# Patient Record
Sex: Male | Born: 1940 | Race: White | Hispanic: No | Marital: Married | Smoking: Former smoker
Health system: Southern US, Community
[De-identification: ages and names within clinical notes are randomized; demographics above are authoritative.]

## PROBLEM LIST (undated history)

## (undated) DIAGNOSIS — R079 Chest pain, unspecified: Secondary | ICD-10-CM

## (undated) DIAGNOSIS — C801 Malignant (primary) neoplasm, unspecified: Secondary | ICD-10-CM

## (undated) DIAGNOSIS — Z9889 Other specified postprocedural states: Secondary | ICD-10-CM

## (undated) DIAGNOSIS — E785 Hyperlipidemia, unspecified: Secondary | ICD-10-CM

## (undated) DIAGNOSIS — I498 Other specified cardiac arrhythmias: Secondary | ICD-10-CM

## (undated) DIAGNOSIS — I219 Acute myocardial infarction, unspecified: Secondary | ICD-10-CM

## (undated) DIAGNOSIS — I7090 Unspecified atherosclerosis: Secondary | ICD-10-CM

## (undated) DIAGNOSIS — E119 Type 2 diabetes mellitus without complications: Secondary | ICD-10-CM

## (undated) DIAGNOSIS — W19XXXA Unspecified fall, initial encounter: Secondary | ICD-10-CM

## (undated) DIAGNOSIS — I10 Essential (primary) hypertension: Secondary | ICD-10-CM

## (undated) DIAGNOSIS — K429 Umbilical hernia without obstruction or gangrene: Secondary | ICD-10-CM

## (undated) DIAGNOSIS — I2510 Atherosclerotic heart disease of native coronary artery without angina pectoris: Secondary | ICD-10-CM

## (undated) HISTORY — PX: CHOLECYSTECTOMY: SHX55

## (undated) HISTORY — PX: HERNIA REPAIR: SHX51

## (undated) HISTORY — PX: COLON SURGERY: SHX602

## (undated) HISTORY — PX: APPENDECTOMY: SHX54

---

## 1979-01-22 DIAGNOSIS — I219 Acute myocardial infarction, unspecified: Secondary | ICD-10-CM

## 1979-01-22 HISTORY — DX: Acute myocardial infarction, unspecified: I21.9

## 2000-01-22 DIAGNOSIS — C801 Malignant (primary) neoplasm, unspecified: Secondary | ICD-10-CM

## 2000-01-22 HISTORY — DX: Malignant (primary) neoplasm, unspecified: C80.1

## 2004-02-24 ENCOUNTER — Ambulatory Visit: Payer: Self-pay | Admitting: Oncology

## 2004-07-13 ENCOUNTER — Ambulatory Visit: Payer: Self-pay | Admitting: Oncology

## 2004-11-09 ENCOUNTER — Ambulatory Visit: Payer: Self-pay | Admitting: Oncology

## 2005-03-15 ENCOUNTER — Ambulatory Visit: Payer: Self-pay | Admitting: Oncology

## 2005-09-27 ENCOUNTER — Ambulatory Visit: Payer: Self-pay | Admitting: Oncology

## 2006-03-21 ENCOUNTER — Ambulatory Visit: Payer: Self-pay | Admitting: Oncology

## 2006-09-05 ENCOUNTER — Ambulatory Visit: Payer: Self-pay | Admitting: Oncology

## 2009-01-21 HISTORY — PX: CARDIAC CATHETERIZATION: SHX172

## 2015-04-13 DIAGNOSIS — Z9889 Other specified postprocedural states: Secondary | ICD-10-CM

## 2015-04-13 HISTORY — DX: Other specified postprocedural states: Z98.890

## 2016-10-30 DIAGNOSIS — W19XXXA Unspecified fall, initial encounter: Secondary | ICD-10-CM

## 2016-10-30 HISTORY — DX: Unspecified fall, initial encounter: W19.XXXA

## 2017-02-14 NOTE — Progress Notes (Signed)
Please place orders in Epic as patient is being scheduled for a pre-op appointment! Thank you! 

## 2017-02-17 ENCOUNTER — Ambulatory Visit: Payer: Self-pay | Admitting: Orthopedic Surgery

## 2017-02-17 NOTE — H&P (Signed)
Darren Randall is an 77 y.o. male.   Chief Complaint:  Back and right leg pain HPI: Patient reports lower back pain and leg pain on the right. Date of injury 10/30/16. He reports (normal) review of test results. He reports work injury. He reports pain level 8/10 and severe.  The patient is currently working with light duty restrictions of no lifting over 10 lbs, no bending, stooping, squatting, no prolonged standing or sitting and local driving only. NCM is [employer].  Patient reports the epidural steroid injection helped on his right side for about 1 week. Is 8 weeks status post his injury. He is here with [name] his case manager and is on light duty with local driving only.  His physical therapy note was reviewed. Feel his function is approximately at 65%.  Medical History Cancer:  Diabetes:  High Cholesterol:  Hypertension:  Rheumatoid Arthritis:  kidney stone  Surgical History Appendectomy Cholecystectomy Colostomy Hernia Repair Colectomy Resection of polyp Surgical procedure on cervical spine Placement of stent in cardiac conduit  Medications atenolol 100 mg tablet atorvastatin 40 mg tablet finasteride 5 mg tablet metFORMIN 500 mg tablet moexipril 15 mg-hydrochlorothiazide 25 mg tablet omeprazole 20 mg capsule,delayed release tamsulosin 0.4 mg capsule terazosin 2 mg capsule  Family History Mother - Diabetes mellitus   - Heart disease Brother - Heart disease   - Hypertensive disorder  Social History:  has no tobacco, alcohol, and drug history on file.  Allergies: NKDA  Review of Systems  Constitutional: Negative.   HENT: Negative.   Eyes: Negative.   Respiratory: Negative.   Cardiovascular: Negative.   Gastrointestinal: Negative.   Genitourinary: Negative.   Musculoskeletal: Positive for back pain.  Skin: Negative.   Neurological: Positive for sensory change and focal weakness.  Psychiatric/Behavioral: Negative.     There were no vitals taken  for this visit. Physical Exam  Constitutional: He is oriented to person, place, and time. He appears well-developed.  HENT:  Head: Normocephalic.  Eyes: Pupils are equal, round, and reactive to light.  Neck: Normal range of motion.  Cardiovascular: Normal rate.  Respiratory: Effort normal.  GI: Soft.  Musculoskeletal:  Constitutional General Appearance: healthy-appearing, distress (mild)  Psychiatric Mood and Affect: active and alert, anxious  Cardiovascular System Edema Right: none (Dorsalis and posterior tibial pulses 2+) Edema Left: none  Cervical Spine Inspection: alignment normal, no muscle atrophy Bony Palpation: no tenderness of the spinous process Active Range of Motion: flexion normal, extension normal Passive Range of Motion: flexion normal, extension normal  Motor Strength C5 on the Right: abduction deltoid 5/5 C5 on the Left: abduction deltoid 5/5 C6 on the Right: flexion biceps 5/5 C6 on the Left: flexion biceps 5/5 C7 on the Right: extension triceps 5/5, flexion wrist 5/5 C7 on the Left: extension triceps 5/5, flexion wrist 5/5 C8 on the Right: flexion fingers 5/5 C8 on the Left: flexion fingers 5/5 T1 on the Right: abduction fingers 5/5 T1 on the Left: abduction fingers 5/5  Neurological System Biceps Reflex Right: normal (2) Biceps Reflex Left: normal on the left (2) Brachioradialis Reflex Right: normal (2) Brachioradialis Reflex Left: normal (2) Triceps Reflex Right: normal (2) Triceps Reflex Left: normal (2) Sensation on the Right: C5 normal, C6 normal, C7 normal, C8 normal, sensation of the distal extremities normal Sensation on the Left: C5 normal, C6 normal, C7 normal, distal extremities normal Special Tests on the Right: Spurling's test negative Special Tests on the Left: Spurling's test negative Special Tests: Valsalva's test  negative (Hoffman Sign negative) Knee Reflex Right: normal (2) Knee Reflex Left: normal (2) Ankle Reflex Right:  diminished (1) Ankle Reflex Left: normal (2) Babinski Reflex Right: plantar reflex absent Babinski Reflex Left: plantar reflex absent Special Tests on the Right: femoral nerve traction test negative, no clonus of the ankle/knee, supine straight leg raising test positive Special Tests on the Left: femoral nerve traction test negative, seated straight leg raising test negative, no clonus of the ankle/knee  Skin Head and Neck: normal Right Upper Extremity: normal Left Upper Extremity: normal Inspection and palpation: no rash  Gait and Station Appearance: ambulating with no assistive devices, antalgic gait  Abdomen Inspection and Palpation: non-distended, no tenderness  Lumbar Spine Inspection: normal alignment Bony Palpation of the Lumbar Spine: (tender at lumbosacral junction.) Bony Palpation of the Right Hip: no tenderness of the greater trochanter, tenderness of the SI joint (Pelvis stable) Bony Palpation of the Left Hip: no tenderness of the greater trochanter Soft Tissue Palpation on the Right: (No flank pain with percussion) Active Range of Motion: (limited flexion and extention)  Motor Strength L1 Motor Strength on the Right: hip flexion iliopsoas 5/5 L1 Motor Strength on the Left: hip flexion iliopsoas 5/5 L2-L4 Motor Strength on the Right: knee extension quadriceps 5/5 L2-L4 Motor Strength on the Left: knee extension quadriceps 5/5 L5 Motor Strength on the Right: ankle dorsiflexion tibialis anterior 5/5, great toe extension extensor hallucis longus 4/5 L5 Motor Strength on the Left: ankle dorsiflexion tibialis anterior 5/5, great toe extension extensor hallucis longus 5/5 S1 Motor Strength on the Right: plantar flexion gastrocnemius 4/5 S1 Motor Strength on the Left: plantar flexion gastrocnemius 5/5  Neurological: He is alert and oriented to person, place, and time.  Skin: Skin is warm and dry.     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 He does have moderate disc space narrowing as well generating relative spinal stenosis as evidenced in the lateral recess. He had more relief from his first injection. The second injection gave him little relief. The pain radiates into the calf muscle and the S1 nerve root distribution. He has a straight leg raise is positive in slight EHL and plantar flexion weakness  We discussed options including living with his symptoms and analgesics versus a microlumbar decompression L5-S1. He does not want to live with his symptoms we therefore discussed microlumbar decompression. Including risks and benefits of bleeding infection damage to neurovascular to is no change in symptoms worsen his symptoms DVT PE anesthetic complication etc. Overnight hospital sutures out in 2 weeks 4 weeks to light duty sedentary 3 months until maximum medical improvement  He is otherwise healthy does have hypertension will kindly request a preoperative clearance. He is currently on aspirin will need to be off of it for weeks postop  In the interim light duty no lifting over 10 pounds repetitive bending prolonged standing or sitting local driving only. 5 hours per day. Discussed this and from the patient with his case Freight forwarder.  We spent extensive time discussing the options and going over the surgical intervention with a model and a power point presentation.  If he has any changes or reductions in his symptoms in the interim he can call.  I do feel this is related to his initial injury as described.  Plan microlumbar decompression L5-S1 right  Cecilie Kicks., PA-C for Dr. Tonita Cong 02/17/2017, 8:53 AM

## 2017-02-17 NOTE — H&P (View-Only) (Signed)
Darren Randall is an 77 y.o. male.   Chief Complaint:  Back and right leg pain HPI: Patient reports lower back pain and leg pain on the right. Date of injury 10/30/16. He reports (normal) review of test results. He reports work injury. He reports pain level 8/10 and severe.  The patient is currently working with light duty restrictions of no lifting over 10 lbs, no bending, stooping, squatting, no prolonged standing or sitting and local driving only. NCM is [employer].  Patient reports the epidural steroid injection helped on his right side for about 1 week. Is 8 weeks status post his injury. He is here with [name] his case manager and is on light duty with local driving only.  His physical therapy note was reviewed. Feel his function is approximately at 65%.  Medical History Cancer:  Diabetes:  High Cholesterol:  Hypertension:  Rheumatoid Arthritis:  kidney stone  Surgical History Appendectomy Cholecystectomy Colostomy Hernia Repair Colectomy Resection of polyp Surgical procedure on cervical spine Placement of stent in cardiac conduit  Medications atenolol 100 mg tablet atorvastatin 40 mg tablet finasteride 5 mg tablet metFORMIN 500 mg tablet moexipril 15 mg-hydrochlorothiazide 25 mg tablet omeprazole 20 mg capsule,delayed release tamsulosin 0.4 mg capsule terazosin 2 mg capsule  Family History Mother - Diabetes mellitus   - Heart disease Brother - Heart disease   - Hypertensive disorder  Social History:  has no tobacco, alcohol, and drug history on file.  Allergies: NKDA  Review of Systems  Constitutional: Negative.   HENT: Negative.   Eyes: Negative.   Respiratory: Negative.   Cardiovascular: Negative.   Gastrointestinal: Negative.   Genitourinary: Negative.   Musculoskeletal: Positive for back pain.  Skin: Negative.   Neurological: Positive for sensory change and focal weakness.  Psychiatric/Behavioral: Negative.     There were no vitals taken  for this visit. Physical Exam  Constitutional: He is oriented to person, place, and time. He appears well-developed.  HENT:  Head: Normocephalic.  Eyes: Pupils are equal, round, and reactive to light.  Neck: Normal range of motion.  Cardiovascular: Normal rate.  Respiratory: Effort normal.  GI: Soft.  Musculoskeletal:  Constitutional General Appearance: healthy-appearing, distress (mild)  Psychiatric Mood and Affect: active and alert, anxious  Cardiovascular System Edema Right: none (Dorsalis and posterior tibial pulses 2+) Edema Left: none  Cervical Spine Inspection: alignment normal, no muscle atrophy Bony Palpation: no tenderness of the spinous process Active Range of Motion: flexion normal, extension normal Passive Range of Motion: flexion normal, extension normal  Motor Strength C5 on the Right: abduction deltoid 5/5 C5 on the Left: abduction deltoid 5/5 C6 on the Right: flexion biceps 5/5 C6 on the Left: flexion biceps 5/5 C7 on the Right: extension triceps 5/5, flexion wrist 5/5 C7 on the Left: extension triceps 5/5, flexion wrist 5/5 C8 on the Right: flexion fingers 5/5 C8 on the Left: flexion fingers 5/5 T1 on the Right: abduction fingers 5/5 T1 on the Left: abduction fingers 5/5  Neurological System Biceps Reflex Right: normal (2) Biceps Reflex Left: normal on the left (2) Brachioradialis Reflex Right: normal (2) Brachioradialis Reflex Left: normal (2) Triceps Reflex Right: normal (2) Triceps Reflex Left: normal (2) Sensation on the Right: C5 normal, C6 normal, C7 normal, C8 normal, sensation of the distal extremities normal Sensation on the Left: C5 normal, C6 normal, C7 normal, distal extremities normal Special Tests on the Right: Spurling's test negative Special Tests on the Left: Spurling's test negative Special Tests: Valsalva's test  negative (Hoffman Sign negative) Knee Reflex Right: normal (2) Knee Reflex Left: normal (2) Ankle Reflex Right:  diminished (1) Ankle Reflex Left: normal (2) Babinski Reflex Right: plantar reflex absent Babinski Reflex Left: plantar reflex absent Special Tests on the Right: femoral nerve traction test negative, no clonus of the ankle/knee, supine straight leg raising test positive Special Tests on the Left: femoral nerve traction test negative, seated straight leg raising test negative, no clonus of the ankle/knee  Skin Head and Neck: normal Right Upper Extremity: normal Left Upper Extremity: normal Inspection and palpation: no rash  Gait and Station Appearance: ambulating with no assistive devices, antalgic gait  Abdomen Inspection and Palpation: non-distended, no tenderness  Lumbar Spine Inspection: normal alignment Bony Palpation of the Lumbar Spine: (tender at lumbosacral junction.) Bony Palpation of the Right Hip: no tenderness of the greater trochanter, tenderness of the SI joint (Pelvis stable) Bony Palpation of the Left Hip: no tenderness of the greater trochanter Soft Tissue Palpation on the Right: (No flank pain with percussion) Active Range of Motion: (limited flexion and extention)  Motor Strength L1 Motor Strength on the Right: hip flexion iliopsoas 5/5 L1 Motor Strength on the Left: hip flexion iliopsoas 5/5 L2-L4 Motor Strength on the Right: knee extension quadriceps 5/5 L2-L4 Motor Strength on the Left: knee extension quadriceps 5/5 L5 Motor Strength on the Right: ankle dorsiflexion tibialis anterior 5/5, great toe extension extensor hallucis longus 4/5 L5 Motor Strength on the Left: ankle dorsiflexion tibialis anterior 5/5, great toe extension extensor hallucis longus 5/5 S1 Motor Strength on the Right: plantar flexion gastrocnemius 4/5 S1 Motor Strength on the Left: plantar flexion gastrocnemius 5/5  Neurological: He is alert and oriented to person, place, and time.  Skin: Skin is warm and dry.     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 He does have moderate disc space narrowing as well generating relative spinal stenosis as evidenced in the lateral recess. He had more relief from his first injection. The second injection gave him little relief. The pain radiates into the calf muscle and the S1 nerve root distribution. He has a straight leg raise is positive in slight EHL and plantar flexion weakness  We discussed options including living with his symptoms and analgesics versus a microlumbar decompression L5-S1. He does not want to live with his symptoms we therefore discussed microlumbar decompression. Including risks and benefits of bleeding infection damage to neurovascular to is no change in symptoms worsen his symptoms DVT PE anesthetic complication etc. Overnight hospital sutures out in 2 weeks 4 weeks to light duty sedentary 3 months until maximum medical improvement  He is otherwise healthy does have hypertension will kindly request a preoperative clearance. He is currently on aspirin will need to be off of it for weeks postop  In the interim light duty no lifting over 10 pounds repetitive bending prolonged standing or sitting local driving only. 5 hours per day. Discussed this and from the patient with his case Freight forwarder.  We spent extensive time discussing the options and going over the surgical intervention with a model and a power point presentation.  If he has any changes or reductions in his symptoms in the interim he can call.  I do feel this is related to his initial injury as described.  Plan microlumbar decompression L5-S1 right  Cecilie Kicks., PA-C for Dr. Tonita Cong 02/17/2017, 8:53 AM

## 2017-02-25 ENCOUNTER — Encounter (HOSPITAL_COMMUNITY): Payer: Self-pay

## 2017-02-25 ENCOUNTER — Other Ambulatory Visit (HOSPITAL_COMMUNITY): Payer: Self-pay | Admitting: Emergency Medicine

## 2017-02-25 NOTE — Progress Notes (Signed)
LOV Jasmine December, NP 02-03-17 on chart   EKG 01-27-17 on chart from [facility]   Echo 04-14-15 on chart from [place] test 08-07-13 on chart from [facility]

## 2017-02-25 NOTE — Patient Instructions (Signed)
Darren Randall  02/25/2017   Your procedure is scheduled on: 03-05-17  Report to [facility] Main  Entrance    Report to admitting at [facility]   Call this number if you have problems the morning of surgery [phone]     Remember: Do not eat food or drink liquids :After Midnight.     Take these medicines the morning of surgery with A SIP OF WATER: atenolol, atorvastatin, omeprazole, terazosin                                 You may not have any metal on your body including hair pins and              piercings  Do not wear jewelry, make-up, lotions, powders or perfumes, deodorant                  Men may shave face and neck.   Do not bring valuables to the hospital. [name].  Contacts, dentures or bridgework may not be worn into surgery.  Leave suitcase in the car. After surgery it may be brought to your room.                 Please read over the following fact sheets you were given: _____________________________________________________________________            How to Manage Your Diabetes Before and After Surgery  Why is it important to control my blood sugar before and after surgery? . Improving blood sugar levels before and after surgery helps healing and can limit problems. . A way of improving blood sugar control is eating a healthy diet by: o  Eating less sugar and carbohydrates o  Increasing activity/exercise o  Talking with your doctor about reaching your blood sugar goals . High blood sugars (greater than 180 mg/dL) can raise your risk of infections and slow your recovery, so you will need to focus on controlling your diabetes during the weeks before surgery. . Make sure that the doctor who takes care of your diabetes knows about your planned surgery including the date and location.  How do I manage my blood sugar before surgery? . Check your blood sugar at least 4 times a day, starting 2  days before surgery, to make sure that the level is not too high or low. o Check your blood sugar the morning of your surgery when you wake up and every 2 hours until you get to the Short Stay unit. . If your blood sugar is less than 70 mg/dL, you will need to treat for low blood sugar: o Do not take insulin. o Treat a low blood sugar (less than 70 mg/dL) with  cup of clear juice (cranberry or apple), 4 glucose tablets, OR glucose gel. o Recheck blood sugar in 15 minutes after treatment (to make sure it is greater than 70 mg/dL). If your blood sugar is not greater than 70 mg/dL on recheck, call [phone] for further instructions. . Report your blood sugar to the short stay nurse when you get to Short Stay.  . If you are admitted to the hospital after surgery: o Your blood sugar will be checked by the staff and you will probably be  given insulin after surgery (instead of oral diabetes medicines) to make sure you have good blood sugar levels. o The goal for blood sugar control after surgery is 80-180 mg/dL.   WHAT DO I DO ABOUT MY DIABETES MEDICATION?   . THE DAY BEFORE SURGERY, take  METFORMIN normally       THE MORNING OF SURGERY, Do not take oral diabetes medicines (pills)   Patient Signature:  Date:   Nurse Signature:  Date:   Reviewed and Endorsed by [name] Patient Education Committee, August 2015    [facility] - Preparing for Surgery Before surgery, you can play an important role.  Because skin is not sterile, your skin needs to be as free of germs as possible.  You can reduce the number of germs on your skin by washing with CHG (chlorahexidine gluconate) soap before surgery.  CHG is an antiseptic cleaner which kills germs and bonds with the skin to continue killing germs even after washing. Please DO NOT use if you have an allergy to CHG or antibacterial soaps.  If your skin becomes reddened/irritated stop using the CHG and inform your nurse when you arrive at Short  Stay. Do not shave (including legs and underarms) for at least 48 hours prior to the first CHG shower.  You may shave your face/neck. Please follow these instructions carefully:  1.  Shower with CHG Soap the night before surgery and the  morning of Surgery.  2.  If you choose to wash your hair, wash your hair first as usual with your  normal  shampoo.  3.  After you shampoo, rinse your hair and body thoroughly to remove the  shampoo.                           4.  Use CHG as you would any other liquid soap.  You can apply chg directly  to the skin and wash                       Gently with a scrungie or clean washcloth.  5.  Apply the CHG Soap to your body ONLY FROM THE NECK DOWN.   Do not use on face/ open                           Wound or open sores. Avoid contact with eyes, ears mouth and genitals (private parts).                       Wash face,  Genitals (private parts) with your normal soap.             6.  Wash thoroughly, paying special attention to the area where your surgery  will be performed.  7.  Thoroughly rinse your body with warm water from the neck down.  8.  DO NOT shower/wash with your normal soap after using and rinsing off  the CHG Soap.                9.  Pat yourself dry with a clean towel.            10.  Wear clean pajamas.            11.  Place clean sheets on your bed the night of your first shower and do not  sleep with pets. Day of Surgery : Do  not apply any lotions/deodorants the morning of surgery.  Please wear clean clothes to the hospital/surgery center.  FAILURE TO FOLLOW THESE INSTRUCTIONS MAY RESULT IN THE CANCELLATION OF YOUR SURGERY PATIENT SIGNATURE_________________________________  NURSE SIGNATURE__________________________________  ________________________________________________________________________   Darren Randall  An incentive spirometer is a tool that can help keep your lungs clear and active. This tool measures how well you are  filling your lungs with each breath. Taking long deep breaths may help reverse or decrease the chance of developing breathing (pulmonary) problems (especially infection) following:  A long period of time when you are unable to move or be active. BEFORE THE PROCEDURE   If the spirometer includes an indicator to show your best effort, your nurse or respiratory therapist will set it to a desired goal.  If possible, sit up straight or lean slightly forward. Try not to slouch.  Hold the incentive spirometer in an upright position. INSTRUCTIONS FOR USE  1. Sit on the edge of your bed if possible, or sit up as far as you can in bed or on a chair. 2. Hold the incentive spirometer in an upright position. 3. Breathe out normally. 4. Place the mouthpiece in your mouth and seal your lips tightly around it. 5. Breathe in slowly and as deeply as possible, raising the piston or the ball toward the top of the column. 6. Hold your breath for 3-5 seconds or for as long as possible. Allow the piston or ball to fall to the bottom of the column. 7. Remove the mouthpiece from your mouth and breathe out normally. 8. Rest for a few seconds and repeat Steps 1 through 7 at least 10 times every 1-2 hours when you are awake. Take your time and take a few normal breaths between deep breaths. 9. The spirometer may include an indicator to show your best effort. Use the indicator as a goal to work toward during each repetition. 10. After each set of 10 deep breaths, practice coughing to be sure your lungs are clear. If you have an incision (the cut made at the time of surgery), support your incision when coughing by placing a pillow or rolled up towels firmly against it. Once you are able to get out of bed, walk around indoors and cough well. You may stop using the incentive spirometer when instructed by your caregiver.  RISKS AND COMPLICATIONS  Take your time so you do not get dizzy or light-headed.  If you are in pain,  you may need to take or ask for pain medication before doing incentive spirometry. It is harder to take a deep breath if you are having pain. AFTER USE  Rest and breathe slowly and easily.  It can be helpful to keep track of a log of your progress. Your caregiver can provide you with a simple table to help with this. If you are using the spirometer at home, follow these instructions: [place] IF:   You are having difficultly using the spirometer.  You have trouble using the spirometer as often as instructed.  Your pain medication is not giving enough relief while using the spirometer.  You develop fever of 100.5 F (38.1 C) or higher. SEEK IMMEDIATE MEDICAL CARE IF:   You cough up bloody sputum that had not been present before.  You develop fever of 102 F (38.9 C) or greater.  You develop worsening pain at or near the incision site. MAKE SURE YOU:   Understand these instructions.  Will watch your condition.  Will get help right away if you are not doing well or get worse. Document Released: 05/20/2006 Document Revised: 04/01/2011 Document Reviewed: 07/21/2006 [facility] Patient Information 2014 [place], [place].   ________________________________________________________________________

## 2017-02-26 ENCOUNTER — Ambulatory Visit (HOSPITAL_COMMUNITY)
Admission: RE | Admit: 2017-02-26 | Discharge: 2017-02-26 | Disposition: A | Discharge status: Home / Self Care | Payer: Worker's Compensation | Source: Ambulatory Visit | Attending: Orthopedic Surgery | Admitting: Orthopedic Surgery

## 2017-02-26 ENCOUNTER — Encounter (HOSPITAL_COMMUNITY)
Admission: RE | Admit: 2017-02-26 | Discharge: 2017-02-26 | Disposition: A | Discharge status: Home / Self Care | Payer: Worker's Compensation | Source: Ambulatory Visit | Attending: Specialist | Admitting: Specialist

## 2017-02-26 ENCOUNTER — Other Ambulatory Visit: Payer: Self-pay

## 2017-02-26 ENCOUNTER — Encounter (HOSPITAL_COMMUNITY): Payer: Self-pay

## 2017-02-26 DIAGNOSIS — M5137 Other intervertebral disc degeneration, lumbosacral region: Secondary | ICD-10-CM | POA: Insufficient documentation

## 2017-02-26 DIAGNOSIS — I700 Atherosclerosis of aorta: Secondary | ICD-10-CM | POA: Insufficient documentation

## 2017-02-26 DIAGNOSIS — M5126 Other intervertebral disc displacement, lumbar region: Secondary | ICD-10-CM

## 2017-02-26 HISTORY — DX: Other specified postprocedural states: Z98.890

## 2017-02-26 HISTORY — DX: Acute myocardial infarction, unspecified: I21.9

## 2017-02-26 HISTORY — DX: Other specified cardiac arrhythmias: I49.8

## 2017-02-26 HISTORY — DX: Malignant (primary) neoplasm, unspecified: C80.1

## 2017-02-26 HISTORY — DX: Atherosclerotic heart disease of native coronary artery without angina pectoris: I25.10

## 2017-02-26 HISTORY — DX: Essential (primary) hypertension: I10

## 2017-02-26 HISTORY — DX: Unspecified fall, initial encounter: W19.XXXA

## 2017-02-26 HISTORY — DX: Unspecified atherosclerosis: I70.90

## 2017-02-26 HISTORY — DX: Umbilical hernia without obstruction or gangrene: K42.9

## 2017-02-26 HISTORY — DX: Type 2 diabetes mellitus without complications: E11.9

## 2017-02-26 HISTORY — DX: Chest pain, unspecified: R07.9

## 2017-02-26 HISTORY — DX: Hyperlipidemia, unspecified: E78.5

## 2017-02-26 LAB — BASIC METABOLIC PANEL
Anion gap: 6 (ref 5–15)
BUN: 16 mg/dL (ref 6–20)
CHLORIDE: 108 mmol/L (ref 101–111)
CO2: 25 mmol/L (ref 22–32)
CREATININE: 0.93 mg/dL (ref 0.61–1.24)
Calcium: 8.6 mg/dL — ABNORMAL LOW (ref 8.9–10.3)
GFR calc Af Amer: >60 mL/min (ref >60)
GFR calc non Af Amer: >60 mL/min (ref >60)
GLUCOSE: 119 mg/dL — AB (ref 65–99)
Potassium: 4.3 mmol/L (ref 3.5–5.1)
Sodium: 139 mmol/L (ref 135–145)

## 2017-02-26 LAB — CBC
HCT: 41.9 % (ref 39.0–52.0)
Hemoglobin: 14.3 g/dL (ref 13.0–17.0)
MCH: 31.7 pg (ref 26.0–34.0)
MCHC: 34.1 g/dL (ref 30.0–36.0)
MCV: 92.9 fL (ref 78.0–100.0)
PLATELETS: 138 10*3/uL — AB (ref 150–400)
RBC: 4.51 MIL/uL (ref 4.22–5.81)
RDW: 12.9 % (ref 11.5–15.5)
WBC: 7.2 10*3/uL (ref 4.0–10.5)

## 2017-02-26 LAB — HEMOGLOBIN A1C
HEMOGLOBIN A1C: 6.3 % — AB (ref 4.8–5.6)
Mean Plasma Glucose: 134.11 mg/dL

## 2017-02-26 LAB — SURGICAL PCR SCREEN
MRSA, PCR: NEGATIVE
Staphylococcus aureus: NEGATIVE

## 2017-02-26 LAB — GLUCOSE, CAPILLARY: GLUCOSE-CAPILLARY: 108 mg/dL — AB (ref 65–99)

## 2017-03-05 ENCOUNTER — Ambulatory Visit (HOSPITAL_COMMUNITY): Payer: Worker's Compensation

## 2017-03-05 ENCOUNTER — Other Ambulatory Visit: Payer: Self-pay

## 2017-03-05 ENCOUNTER — Ambulatory Visit (HOSPITAL_COMMUNITY): Payer: Worker's Compensation | Admitting: Anesthesiology

## 2017-03-05 ENCOUNTER — Encounter (HOSPITAL_COMMUNITY)
Admission: RE | Disposition: A | Discharge status: Home / Self Care | Payer: Self-pay | Source: Ambulatory Visit | Attending: Specialist

## 2017-03-05 ENCOUNTER — Encounter (HOSPITAL_COMMUNITY): Payer: Self-pay | Admitting: Anesthesiology

## 2017-03-05 ENCOUNTER — Ambulatory Visit (HOSPITAL_COMMUNITY)
Admission: RE | Admit: 2017-03-05 | Discharge: 2017-03-06 | Disposition: A | Discharge status: Home / Self Care | Payer: Worker's Compensation | Source: Ambulatory Visit | Attending: Specialist | Admitting: Specialist

## 2017-03-05 DIAGNOSIS — I252 Old myocardial infarction: Secondary | ICD-10-CM | POA: Insufficient documentation

## 2017-03-05 DIAGNOSIS — G8929 Other chronic pain: Secondary | ICD-10-CM

## 2017-03-05 DIAGNOSIS — I10 Essential (primary) hypertension: Secondary | ICD-10-CM | POA: Diagnosis not present

## 2017-03-05 DIAGNOSIS — E119 Type 2 diabetes mellitus without complications: Secondary | ICD-10-CM | POA: Insufficient documentation

## 2017-03-05 DIAGNOSIS — M5127 Other intervertebral disc displacement, lumbosacral region: Secondary | ICD-10-CM | POA: Insufficient documentation

## 2017-03-05 DIAGNOSIS — E785 Hyperlipidemia, unspecified: Secondary | ICD-10-CM | POA: Diagnosis not present

## 2017-03-05 DIAGNOSIS — Z85038 Personal history of other malignant neoplasm of large intestine: Secondary | ICD-10-CM | POA: Diagnosis not present

## 2017-03-05 DIAGNOSIS — I2510 Atherosclerotic heart disease of native coronary artery without angina pectoris: Secondary | ICD-10-CM | POA: Diagnosis not present

## 2017-03-05 DIAGNOSIS — Z7982 Long term (current) use of aspirin: Secondary | ICD-10-CM | POA: Insufficient documentation

## 2017-03-05 DIAGNOSIS — Z7984 Long term (current) use of oral hypoglycemic drugs: Secondary | ICD-10-CM | POA: Insufficient documentation

## 2017-03-05 DIAGNOSIS — M549 Dorsalgia, unspecified: Secondary | ICD-10-CM

## 2017-03-05 DIAGNOSIS — Z79899 Other long term (current) drug therapy: Secondary | ICD-10-CM | POA: Diagnosis not present

## 2017-03-05 DIAGNOSIS — M069 Rheumatoid arthritis, unspecified: Secondary | ICD-10-CM | POA: Insufficient documentation

## 2017-03-05 DIAGNOSIS — Z87891 Personal history of nicotine dependence: Secondary | ICD-10-CM | POA: Diagnosis not present

## 2017-03-05 DIAGNOSIS — I700 Atherosclerosis of aorta: Secondary | ICD-10-CM | POA: Diagnosis not present

## 2017-03-05 DIAGNOSIS — M4807 Spinal stenosis, lumbosacral region: Secondary | ICD-10-CM | POA: Insufficient documentation

## 2017-03-05 DIAGNOSIS — M5126 Other intervertebral disc displacement, lumbar region: Secondary | ICD-10-CM | POA: Diagnosis present

## 2017-03-05 HISTORY — PX: LUMBAR LAMINECTOMY/DECOMPRESSION MICRODISCECTOMY: SHX5026

## 2017-03-05 LAB — GLUCOSE, CAPILLARY
GLUCOSE-CAPILLARY: 128 mg/dL — AB (ref 65–99)
GLUCOSE-CAPILLARY: 154 mg/dL — AB (ref 65–99)
Glucose-Capillary: 197 mg/dL — ABNORMAL HIGH (ref 65–99)

## 2017-03-05 SURGERY — LUMBAR LAMINECTOMY/DECOMPRESSION MICRODISCECTOMY 1 LEVEL
Anesthesia: General | Site: Back | Laterality: Right

## 2017-03-05 MED ORDER — TERAZOSIN HCL 2 MG PO CAPS
2.0000 mg | ORAL_CAPSULE | Freq: Every day | ORAL | Status: DC
  Administered 2017-03-06: 2 mg via ORAL
  Filled 2017-03-05: qty 1

## 2017-03-05 MED ORDER — ALUM & MAG HYDROXIDE-SIMETH 200-200-20 MG/5ML PO SUSP: 30.0000 mL | Freq: Four times a day (QID) | ORAL | Status: DC | PRN

## 2017-03-05 MED ORDER — HYDROCHLOROTHIAZIDE 25 MG PO TABS
25.0000 mg | ORAL_TABLET | Freq: Every day | ORAL | Status: DC
  Filled 2017-03-05: qty 1

## 2017-03-05 MED ORDER — OXYCODONE-ACETAMINOPHEN 5-325 MG PO TABS
1.0000 | ORAL_TABLET | ORAL | 0 refills | Status: AC | PRN
Start: 2017-03-05 — End: ?

## 2017-03-05 MED ORDER — CEFAZOLIN SODIUM-DEXTROSE 2-4 GM/100ML-% IV SOLN
2.0000 g | INTRAVENOUS | Status: AC
  Administered 2017-03-05: 2 g via INTRAVENOUS
  Filled 2017-03-05: qty 100

## 2017-03-05 MED ORDER — SODIUM CHLORIDE 0.9 % IV SOLN
INTRAVENOUS | Status: AC
  Filled 2017-03-05: qty 500000

## 2017-03-05 MED ORDER — PROPOFOL 10 MG/ML IV BOLUS
INTRAVENOUS | Status: AC
Start: 2017-03-05 — End: 2017-03-05
  Filled 2017-03-05: qty 40

## 2017-03-05 MED ORDER — ONDANSETRON HCL 4 MG/2ML IJ SOLN: 4.0000 mg | Freq: Four times a day (QID) | INTRAMUSCULAR | Status: DC | PRN

## 2017-03-05 MED ORDER — PHENYLEPHRINE 40 MCG/ML (10ML) SYRINGE FOR IV PUSH (FOR BLOOD PRESSURE SUPPORT)
PREFILLED_SYRINGE | INTRAVENOUS | Status: AC
  Filled 2017-03-05: qty 10

## 2017-03-05 MED ORDER — ONDANSETRON HCL 4 MG/2ML IJ SOLN: 4.0000 mg | Freq: Once | INTRAMUSCULAR | Status: DC | PRN

## 2017-03-05 MED ORDER — PHENOL 1.4 % MT LIQD: 1.0000 | OROMUCOSAL | Status: DC | PRN

## 2017-03-05 MED ORDER — EPHEDRINE 5 MG/ML INJ
INTRAVENOUS | Status: AC
  Filled 2017-03-05: qty 10

## 2017-03-05 MED ORDER — TRANDOLAPRIL 2 MG PO TABS
2.0000 mg | ORAL_TABLET | Freq: Every day | ORAL | Status: DC
  Filled 2017-03-05: qty 1

## 2017-03-05 MED ORDER — ONDANSETRON HCL 4 MG PO TABS: 4.0000 mg | ORAL_TABLET | Freq: Four times a day (QID) | ORAL | Status: DC | PRN

## 2017-03-05 MED ORDER — MAGNESIUM CITRATE PO SOLN: 1.0000 | Freq: Once | ORAL | Status: DC | PRN

## 2017-03-05 MED ORDER — OXYCODONE HCL 5 MG PO TABS
5.0000 mg | ORAL_TABLET | ORAL | Status: DC | PRN
  Administered 2017-03-05: 5 mg via ORAL
  Filled 2017-03-05: qty 1

## 2017-03-05 MED ORDER — FENTANYL CITRATE (PF) 100 MCG/2ML IJ SOLN: 25.0000 ug | INTRAMUSCULAR | Status: DC | PRN

## 2017-03-05 MED ORDER — SUGAMMADEX SODIUM 500 MG/5ML IV SOLN
INTRAVENOUS | Status: AC
  Filled 2017-03-05: qty 10

## 2017-03-05 MED ORDER — CEFAZOLIN SODIUM-DEXTROSE 2-4 GM/100ML-% IV SOLN
2.0000 g | Freq: Three times a day (TID) | INTRAVENOUS | Status: AC
  Administered 2017-03-05 – 2017-03-06 (×2): 2 g via INTRAVENOUS
  Filled 2017-03-05 (×2): qty 100

## 2017-03-05 MED ORDER — METHOCARBAMOL 1000 MG/10ML IJ SOLN
500.0000 mg | Freq: Four times a day (QID) | INTRAVENOUS | Status: DC | PRN
  Administered 2017-03-05: 500 mg via INTRAVENOUS
  Filled 2017-03-05: qty 550

## 2017-03-05 MED ORDER — DEXAMETHASONE SODIUM PHOSPHATE 10 MG/ML IJ SOLN
INTRAMUSCULAR | Status: DC | PRN
  Administered 2017-03-05: 10 mg via INTRAVENOUS

## 2017-03-05 MED ORDER — ONDANSETRON HCL 4 MG/2ML IJ SOLN
INTRAMUSCULAR | Status: DC | PRN
  Administered 2017-03-05: 4 mg via INTRAVENOUS

## 2017-03-05 MED ORDER — MENTHOL 3 MG MT LOZG: 1.0000 | LOZENGE | OROMUCOSAL | Status: DC | PRN

## 2017-03-05 MED ORDER — PROPOFOL 10 MG/ML IV BOLUS
INTRAVENOUS | Status: DC | PRN
  Administered 2017-03-05: 110 mg via INTRAVENOUS

## 2017-03-05 MED ORDER — LIDOCAINE 2% (20 MG/ML) 5 ML SYRINGE
INTRAMUSCULAR | Status: DC | PRN
  Administered 2017-03-05: 100 mg via INTRAVENOUS

## 2017-03-05 MED ORDER — FENTANYL CITRATE (PF) 100 MCG/2ML IJ SOLN
INTRAMUSCULAR | Status: AC
  Filled 2017-03-05: qty 2

## 2017-03-05 MED ORDER — POLYETHYLENE GLYCOL 3350 17 G PO PACK: 17.0000 g | PACK | Freq: Every day | ORAL | Status: DC | PRN

## 2017-03-05 MED ORDER — BUPIVACAINE-EPINEPHRINE 0.5% -1:200000 IJ SOLN
INTRAMUSCULAR | Status: DC | PRN
  Administered 2017-03-05: 10 mL

## 2017-03-05 MED ORDER — POLYETHYLENE GLYCOL 3350 17 G PO PACK: 17.0000 g | PACK | Freq: Every day | ORAL | 0 refills | Status: AC

## 2017-03-05 MED ORDER — ACETAMINOPHEN 325 MG PO TABS
650.0000 mg | ORAL_TABLET | ORAL | Status: DC | PRN
  Administered 2017-03-05: 650 mg via ORAL
  Filled 2017-03-05: qty 2

## 2017-03-05 MED ORDER — INSULIN ASPART 100 UNIT/ML ~~LOC~~ SOLN
0.0000 [IU] | Freq: Three times a day (TID) | SUBCUTANEOUS | Status: DC
  Administered 2017-03-06: 08:00:00 2 [IU] via SUBCUTANEOUS

## 2017-03-05 MED ORDER — OXYCODONE HCL 5 MG PO TABS: 10.0000 mg | ORAL_TABLET | ORAL | Status: DC | PRN

## 2017-03-05 MED ORDER — EPHEDRINE SULFATE-NACL 50-0.9 MG/10ML-% IV SOSY
PREFILLED_SYRINGE | INTRAVENOUS | Status: DC | PRN
  Administered 2017-03-05: 10 mg via INTRAVENOUS
  Administered 2017-03-05 (×2): 5 mg via INTRAVENOUS
  Administered 2017-03-05: 10 mg via INTRAVENOUS
  Administered 2017-03-05: 5 mg via INTRAVENOUS

## 2017-03-05 MED ORDER — DOCUSATE SODIUM 100 MG PO CAPS
100.0000 mg | ORAL_CAPSULE | Freq: Two times a day (BID) | ORAL | Status: DC
  Administered 2017-03-05 – 2017-03-06 (×2): 100 mg via ORAL
  Filled 2017-03-05 (×2): qty 1

## 2017-03-05 MED ORDER — PANTOPRAZOLE SODIUM 40 MG PO TBEC
40.0000 mg | DELAYED_RELEASE_TABLET | Freq: Every day | ORAL | Status: DC
  Administered 2017-03-05 – 2017-03-06 (×2): 40 mg via ORAL
  Filled 2017-03-05 (×2): qty 1

## 2017-03-05 MED ORDER — BUPIVACAINE-EPINEPHRINE (PF) 0.5% -1:200000 IJ SOLN
INTRAMUSCULAR | Status: AC
  Filled 2017-03-05: qty 30

## 2017-03-05 MED ORDER — BISACODYL 5 MG PO TBEC: 5.0000 mg | DELAYED_RELEASE_TABLET | Freq: Every day | ORAL | Status: DC | PRN

## 2017-03-05 MED ORDER — PHENYLEPHRINE 40 MCG/ML (10ML) SYRINGE FOR IV PUSH (FOR BLOOD PRESSURE SUPPORT)
PREFILLED_SYRINGE | INTRAVENOUS | Status: DC | PRN
  Administered 2017-03-05 (×4): 80 ug via INTRAVENOUS

## 2017-03-05 MED ORDER — ACETAMINOPHEN 10 MG/ML IV SOLN
1000.0000 mg | INTRAVENOUS | Status: AC
  Administered 2017-03-05: 1000 mg via INTRAVENOUS
  Filled 2017-03-05: qty 100

## 2017-03-05 MED ORDER — ROCURONIUM BROMIDE 10 MG/ML (PF) SYRINGE
PREFILLED_SYRINGE | INTRAVENOUS | Status: DC | PRN
  Administered 2017-03-05: 10 mg via INTRAVENOUS
  Administered 2017-03-05: 50 mg via INTRAVENOUS

## 2017-03-05 MED ORDER — ATENOLOL 50 MG PO TABS
100.0000 mg | ORAL_TABLET | Freq: Every day | ORAL | Status: DC
  Administered 2017-03-06: 09:00:00 100 mg via ORAL
  Filled 2017-03-05: qty 2

## 2017-03-05 MED ORDER — THROMBIN (RECOMBINANT) 5000 UNITS EX SOLR
CUTANEOUS | Status: AC
  Filled 2017-03-05: qty 10000

## 2017-03-05 MED ORDER — HYDROMORPHONE HCL 1 MG/ML IJ SOLN
0.5000 mg | INTRAMUSCULAR | Status: DC | PRN
  Administered 2017-03-05: 0.5 mg via INTRAVENOUS
  Filled 2017-03-05: qty 0.5

## 2017-03-05 MED ORDER — METHOCARBAMOL 500 MG PO TABS
500.0000 mg | ORAL_TABLET | Freq: Four times a day (QID) | ORAL | Status: DC | PRN
  Administered 2017-03-05: 500 mg via ORAL
  Filled 2017-03-05: qty 1

## 2017-03-05 MED ORDER — LACTATED RINGERS IV SOLN
INTRAVENOUS | Status: DC
  Administered 2017-03-05: 10:00:00 via INTRAVENOUS

## 2017-03-05 MED ORDER — FENTANYL CITRATE (PF) 100 MCG/2ML IJ SOLN
INTRAMUSCULAR | Status: DC | PRN
  Administered 2017-03-05: 50 ug via INTRAVENOUS
  Administered 2017-03-05: 100 ug via INTRAVENOUS
  Administered 2017-03-05: 50 ug via INTRAVENOUS

## 2017-03-05 MED ORDER — DOCUSATE SODIUM 100 MG PO CAPS: 100.0000 mg | ORAL_CAPSULE | Freq: Two times a day (BID) | ORAL | 1 refills | Status: AC | PRN

## 2017-03-05 MED ORDER — MOEXIPRIL-HYDROCHLOROTHIAZIDE 15-25 MG PO TABS: 1.0000 | ORAL_TABLET | Freq: Every day | ORAL | Status: DC

## 2017-03-05 MED ORDER — ACETAMINOPHEN 650 MG RE SUPP: 650.0000 mg | RECTAL | Status: DC | PRN

## 2017-03-05 MED ORDER — KCL IN DEXTROSE-NACL 20-5-0.45 MEQ/L-%-% IV SOLN
INTRAVENOUS | Status: DC
  Administered 2017-03-05: 15:00:00 via INTRAVENOUS
  Filled 2017-03-05 (×2): qty 1000

## 2017-03-05 MED ORDER — FINASTERIDE 5 MG PO TABS
5.0000 mg | ORAL_TABLET | Freq: Every day | ORAL | Status: DC
  Administered 2017-03-06: 5 mg via ORAL
  Filled 2017-03-05 (×2): qty 1

## 2017-03-05 MED ORDER — SODIUM CHLORIDE 0.9 % IV SOLN
INTRAVENOUS | Status: DC | PRN
  Administered 2017-03-05: 500 mL

## 2017-03-05 SURGICAL SUPPLY — 51 items
AGENT HMST SPONGE THK3/8 (HEMOSTASIS)
BAG SPEC THK2 15X12 ZIP CLS (MISCELLANEOUS) ×1
BAG ZIPLOCK 12X15 (MISCELLANEOUS) ×2 IMPLANT
CLEANER TIP ELECTROSURG 2X2 (MISCELLANEOUS) ×3 IMPLANT
CLOSURE WOUND 1/2 X4 (GAUZE/BANDAGES/DRESSINGS) ×1
CLOTH 2% CHLOROHEXIDINE 3PK (PERSONAL CARE ITEMS) ×3 IMPLANT
COVER SURGICAL LIGHT HANDLE (MISCELLANEOUS) ×3 IMPLANT
DRAPE LAPAROTOMY T 98X78 PEDS (DRAPES) ×2 IMPLANT
DRAPE MICROSCOPE LEICA (MISCELLANEOUS) ×3 IMPLANT
DRAPE POUCH INSTRU U-SHP 10X18 (DRAPES) ×3 IMPLANT
DRAPE SHEET LG 3/4 BI-LAMINATE (DRAPES) ×3 IMPLANT
DRAPE SURG 17X11 SM STRL (DRAPES) ×3 IMPLANT
DRAPE UTILITY XL STRL (DRAPES) ×3 IMPLANT
DRSG AQUACEL AG ADV 3.5X 4 (GAUZE/BANDAGES/DRESSINGS) ×2 IMPLANT
DRSG AQUACEL AG ADV 3.5X 6 (GAUZE/BANDAGES/DRESSINGS) IMPLANT
DURAPREP 26ML APPLICATOR (WOUND CARE) ×3 IMPLANT
DURASEAL SPINE SEALANT 3ML (MISCELLANEOUS) IMPLANT
ELECT BLADE TIP CTD 4 INCH (ELECTRODE) ×2 IMPLANT
ELECT REM PT RETURN 15FT ADLT (MISCELLANEOUS) ×3 IMPLANT
GLOVE BIOGEL PI IND STRL 7.0 (GLOVE) ×1 IMPLANT
GLOVE BIOGEL PI INDICATOR 7.0 (GLOVE) ×2
GLOVE SURG SS PI 7.0 STRL IVOR (GLOVE) ×3 IMPLANT
GLOVE SURG SS PI 7.5 STRL IVOR (GLOVE) ×3 IMPLANT
GLOVE SURG SS PI 8.0 STRL IVOR (GLOVE) ×6 IMPLANT
GOWN STRL REUS W/TWL XL LVL3 (GOWN DISPOSABLE) ×6 IMPLANT
HEMOSTAT SPONGE AVITENE ULTRA (HEMOSTASIS) IMPLANT
IV CATH 14GX2 1/4 (CATHETERS) ×3 IMPLANT
KIT BASIN OR (CUSTOM PROCEDURE TRAY) ×3 IMPLANT
KIT POSITIONING SURG ANDREWS (MISCELLANEOUS) ×3 IMPLANT
MANIFOLD NEPTUNE II (INSTRUMENTS) ×3 IMPLANT
NEEDLE SPNL 18GX3.5 QUINCKE PK (NEEDLE) ×6 IMPLANT
PACK LAMINECTOMY ORTHO (CUSTOM PROCEDURE TRAY) ×3 IMPLANT
PATTIES SURGICAL .5 X.5 (GAUZE/BANDAGES/DRESSINGS) IMPLANT
PATTIES SURGICAL .75X.75 (GAUZE/BANDAGES/DRESSINGS) ×3 IMPLANT
PATTIES SURGICAL 1X1 (DISPOSABLE) IMPLANT
RUBBERBAND STERILE (MISCELLANEOUS) ×6 IMPLANT
SPONGE SURGIFOAM ABS GEL 100 (HEMOSTASIS) ×3 IMPLANT
STAPLER VISISTAT (STAPLE) IMPLANT
STRIP CLOSURE SKIN 1/2X4 (GAUZE/BANDAGES/DRESSINGS) ×2 IMPLANT
SUT NURALON 4 0 TR CR/8 (SUTURE) IMPLANT
SUT PROLENE 3 0 PS 2 (SUTURE) ×3 IMPLANT
SUT VIC AB 1 CT1 27 (SUTURE)
SUT VIC AB 1 CT1 27XBRD ANTBC (SUTURE) IMPLANT
SUT VIC AB 1-0 CT2 27 (SUTURE) ×3 IMPLANT
SUT VIC AB 2-0 CT1 27 (SUTURE) ×3
SUT VIC AB 2-0 CT1 TAPERPNT 27 (SUTURE) IMPLANT
SUT VIC AB 2-0 CT2 27 (SUTURE) ×1 IMPLANT
SYR 3ML LL SCALE MARK (SYRINGE) IMPLANT
TOWEL OR 17X26 10 PK STRL BLUE (TOWEL DISPOSABLE) ×3 IMPLANT
TOWEL OR NON WOVEN STRL DISP B (DISPOSABLE) ×2 IMPLANT
YANKAUER SUCT BULB TIP NO VENT (SUCTIONS) ×3 IMPLANT

## 2017-03-05 NOTE — Plan of Care (Signed)
Plan of care discussed.   

## 2017-03-05 NOTE — Anesthesia Procedure Notes (Signed)
Procedure Name: Intubation Date/Time: 03/05/2017 11:09 AM Performed by: Lavina Hamman, CRNA Pre-anesthesia Checklist: Patient identified, Emergency Drugs available, Suction available, Patient being monitored and Timeout performed Patient Re-evaluated:Patient Re-evaluated prior to induction Oxygen Delivery Method: Circle system utilized Preoxygenation: Pre-oxygenation with 100% oxygen Induction Type: IV induction Ventilation: Mask ventilation without difficulty Laryngoscope Size: Mac and 4 Grade View: Grade I Tube type: Oral Tube size: 7.5 mm Number of attempts: 1 Airway Equipment and Method: Stylet Placement Confirmation: ETT inserted through vocal cords under direct vision,  positive ETCO2,  CO2 detector and breath sounds checked- equal and bilateral Secured at: 23 cm Tube secured with: Tape Dental Injury: Teeth and Oropharynx as per pre-operative assessment

## 2017-03-05 NOTE — Discharge Instructions (Signed)

## 2017-03-05 NOTE — Evaluation (Signed)
Physical Therapy Evaluation Patient Details Name: Darren Randall MRN: [id number] DOB: 1940-08-11 Today's Date: 03/05/2017   History of Present Illness  Microlumbar decompression L5-S1 Right   Clinical Impression  The patient reports no back pain,ambulated x 200'. Pt admitted with above diagnosis. Pt currently with functional limitations due to the deficits listed below (see PT Problem List). Pt will benefit from skilled PT to increase their independence and safety with mobility to allow discharge to the venue listed below.       Follow Up Recommendations No PT follow up    Equipment Recommendations  None recommended by PT    Recommendations for Other Services       Precautions / Restrictions Precautions Precautions: Back      Mobility  Bed Mobility Overal bed mobility: Needs Assistance Bed Mobility: Rolling;Sidelying to Sit Rolling: Supervision Sidelying to sit: Supervision       General bed mobility comments: cues for precautions  Transfers Overall transfer level: Needs assistance Equipment used: Rolling walker (2 wheeled) Transfers: Sit to/from Stand Sit to Stand: Supervision            Ambulation/Gait Ambulation/Gait assistance: Supervision Ambulation Distance (Feet): 200 Feet Assistive device: Rolling walker (2 wheeled) Gait Pattern/deviations: Step-through pattern     General Gait Details: cues for safety  Stairs            Wheelchair Mobility    Modified Rankin (Stroke Patients Only)       Balance                                             Pertinent Vitals/Pain      Home Living Family/patient expects to be discharged to:: Private residence Living Arrangements: Spouse/significant other Available Help at Discharge: Family Type of Home: House Home Access: Stairs to enter   Technical brewer of Steps: 1 Home Layout: One level Home Equipment: None      Prior Function Level of Independence: Independent          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        Extremity/Trunk Assessment   Upper Extremity Assessment Upper Extremity Assessment: Defer to OT evaluation    Lower Extremity Assessment Lower Extremity Assessment: Overall WFL for tasks assessed    Cervical / Trunk Assessment Cervical / Trunk Assessment: Normal  Communication   Communication: No difficulties  Cognition Arousal/Alertness: Awake/alert Behavior During Therapy: WFL for tasks assessed/performed Overall Cognitive Status: Within Functional Limits for tasks assessed                                        General Comments      Exercises     Assessment/Plan    PT Assessment Patient needs continued PT services  PT Problem List Decreased activity tolerance;Decreased mobility;Decreased knowledge of precautions       PT Treatment Interventions DME instruction;Gait training;Stair training;Functional mobility training    PT Goals (Current goals can be found in the Care Plan section)  Acute Rehab PT Goals Patient Stated Goal: to go home PT Goal Formulation: With patient/family    Frequency Min 5X/week   Barriers to discharge        Co-evaluation  AM-PAC PT "6 Clicks" Daily Activity  Outcome Measure Difficulty turning over in bed (including adjusting bedclothes, sheets and blankets)?: None Difficulty moving from lying on back to sitting on the side of the bed? : None Difficulty sitting down on and standing up from a chair with arms (e.g., wheelchair, bedside commode, etc,.)?: None Help needed moving to and from a bed to chair (including a wheelchair)?: None Help needed walking in hospital room?: A Little Help needed climbing 3-5 steps with a railing? : A Little 6 Click Score: 22    End of Session   Activity Tolerance: Patient tolerated treatment well Patient left: in chair;with call bell/phone within reach;with chair alarm  set;with family/visitor present Nurse Communication: Mobility status PT Visit Diagnosis: Unsteadiness on feet (R26.81)    Time: 6270-3500 PT Time Calculation (min) (ACUTE ONLY): 15 min   Charges:   PT Evaluation $PT Eval Low Complexity: 1 Low     PT G CodesTresa Endo PT [phone]   [name] 03/05/2017, 4:35 PM

## 2017-03-05 NOTE — Transfer of Care (Signed)
Immediate Anesthesia Transfer of Care Note  Patient: Darren Randall  Procedure(s) Performed: Microlumbar decompression L5-S1 Right (Right Back)  Patient Location: PACU  Anesthesia Type:General  Level of Consciousness: awake, alert  and oriented  Airway & Oxygen Therapy: Patient Spontanous Breathing and Patient connected to face mask oxygen  Post-op Assessment: Report given to RN  Post vital signs: Reviewed and stable  Last Vitals:  Vitals:   03/05/17 0906  BP: 131/84  Pulse: (!) 55  Resp: 18  Temp: 36.8 C  SpO2: 98%    Last Pain:  Vitals:   03/05/17 0906  TempSrc: Oral         Complications: No apparent anesthesia complications

## 2017-03-05 NOTE — Interval H&P Note (Signed)
History and Physical Interval Note:  03/05/2017 10:59 AM  Darren Randall  has presented today for surgery, with the diagnosis of HNP/Stenosis L5-S1 Right  The various methods of treatment have been discussed with the patient and family. After consideration of risks, benefits and other options for treatment, the patient has consented to  Procedure(s) with comments: Microlumbar decompression L5-S1 Right (Right) - 120 mins as a surgical intervention .  The patient's history has been reviewed, patient examined, no change in status, stable for surgery.  I have reviewed the patient's chart and labs.  Questions were answered to the patient's satisfaction.     Oberon Hehir C

## 2017-03-05 NOTE — Brief Op Note (Signed)
03/05/2017  12:16 PM  PATIENT:  Darren Randall  77 y.o. male  PRE-OPERATIVE DIAGNOSIS:  HNP/Stenosis L5-S1 Right  POST-OPERATIVE DIAGNOSIS:  * No post-op diagnosis entered *  PROCEDURE:  Procedure(s) with comments: Microlumbar decompression L5-S1 Right (Right) - 120 mins  SURGEON:  Surgeon(s) and Role:    Susa Day, MD - Primary  PHYSICIAN ASSISTANT:   ASSISTANTS: [place]   ANESTHESIA:   general  EBL:  25 mL   BLOOD ADMINISTERED:none  DRAINS: none   LOCAL MEDICATIONS USED:  MARCAINE     SPECIMEN:  Source of Specimen:  L5S1  DISPOSITION OF SPECIMEN:  PATHOLOGY  COUNTS:  YES  TOURNIQUET:  * No tourniquets in log *  DICTATION: .Other Dictation: Dictation Number [id number]  PLAN OF CARE: Admit for overnight observation  PATIENT DISPOSITION:  PACU - hemodynamically stable.   Delay start of Pharmacological VTE agent (>24hrs) due to surgical blood loss or risk of bleeding: yes

## 2017-03-05 NOTE — Anesthesia Preprocedure Evaluation (Signed)
Anesthesia Evaluation  Patient identified by MRN, date of birth, ID band Patient awake    Reviewed: Allergy & Precautions, NPO status , Patient's Chart, lab work & pertinent test results, reviewed documented beta blocker date and time   Airway Mallampati: II  TM Distance: >3 FB Neck ROM: Full    Dental  (+) Dental Advisory Given, Upper Dentures, Missing   Pulmonary former smoker,    Pulmonary exam normal breath sounds clear to auscultation       Cardiovascular hypertension, Pt. on home beta blockers and Pt. on medications + CAD and + Past MI  Normal cardiovascular exam Rhythm:Regular Rate:Normal     Neuro/Psych RLE weakness negative psych ROS   GI/Hepatic Neg liver ROS, GERD  Medicated and Controlled,  Endo/Other  diabetes, Type 2, Oral Hypoglycemic Agents  Renal/GU negative Renal ROS     Musculoskeletal negative musculoskeletal ROS (+)   Abdominal   Peds  Hematology negative hematology ROS (+)   Anesthesia Other Findings Day of surgery medications reviewed with the patient.  Reproductive/Obstetrics                             Anesthesia Physical Anesthesia Plan  ASA: III  Anesthesia Plan: General   Post-op Pain Management:    Induction: Intravenous  PONV Risk Score and Plan: 2 and Dexamethasone and Ondansetron  Airway Management Planned: Oral ETT  Additional Equipment:   Intra-op Plan:   Post-operative Plan: Extubation in OR  Informed Consent: I have reviewed the patients History and Physical, chart, labs and discussed the procedure including the risks, benefits and alternatives for the proposed anesthesia with the patient or authorized representative who has indicated his/her understanding and acceptance.   Dental advisory given  Plan Discussed with: CRNA  Anesthesia Plan Comments: (Risks/benefits of general anesthesia discussed with patient including risk of damage to  teeth, lips, gum, and tongue, nausea/vomiting, allergic reactions to medications, and the possibility of heart attack, stroke and death.  All patient questions answered.  Patient wishes to proceed.)        Anesthesia Quick Evaluation

## 2017-03-05 NOTE — Anesthesia Postprocedure Evaluation (Signed)
Anesthesia Post Note  Patient: VALENTINE BARNEY  Procedure(s) Performed: Microlumbar decompression L5-S1 Right (Right Back)     Patient location during evaluation: PACU Anesthesia Type: General Level of consciousness: awake and alert Pain management: pain level controlled Vital Signs Assessment: post-procedure vital signs reviewed and stable Respiratory status: spontaneous breathing, nonlabored ventilation and respiratory function stable Cardiovascular status: blood pressure returned to baseline and stable Postop Assessment: no apparent nausea or vomiting Anesthetic complications: no    Last Vitals:  Vitals:   03/05/17 1315 03/05/17 1330  BP: (!) 149/83 (!) 149/79  Pulse: 68 66  Resp: 17 19  Temp:  36.8 C  SpO2: 97% 96%    Last Pain:  Vitals:   03/05/17 1330  TempSrc:   PainSc: 0-No pain    LLE Motor Response: Purposeful movement (03/05/17 1330) LLE Sensation: No numbness;No tingling (03/05/17 1330) RLE Motor Response: Purposeful movement (03/05/17 1330) RLE Sensation: No numbness;No tingling (03/05/17 1330)      Catalina Gravel

## 2017-03-06 ENCOUNTER — Encounter (HOSPITAL_COMMUNITY): Payer: Self-pay | Admitting: Specialist

## 2017-03-06 DIAGNOSIS — M5127 Other intervertebral disc displacement, lumbosacral region: Secondary | ICD-10-CM | POA: Diagnosis not present

## 2017-03-06 LAB — COMPREHENSIVE METABOLIC PANEL
ALBUMIN: 3.3 g/dL — AB (ref 3.5–5.0)
ALK PHOS: 56 U/L (ref 38–126)
ALT: 27 U/L (ref 17–63)
ANION GAP: 10 (ref 5–15)
AST: 24 U/L (ref 15–41)
BUN: 16 mg/dL (ref 6–20)
CALCIUM: 8.3 mg/dL — AB (ref 8.9–10.3)
CHLORIDE: 106 mmol/L (ref 101–111)
CO2: 22 mmol/L (ref 22–32)
CREATININE: 0.74 mg/dL (ref 0.61–1.24)
GFR calc non Af Amer: >60 mL/min (ref >60)
Glucose, Bld: 164 mg/dL — ABNORMAL HIGH (ref 65–99)
Potassium: 4 mmol/L (ref 3.5–5.1)
SODIUM: 138 mmol/L (ref 135–145)
Total Bilirubin: 0.9 mg/dL (ref 0.3–1.2)
Total Protein: 6 g/dL — ABNORMAL LOW (ref 6.5–8.1)

## 2017-03-06 LAB — GLUCOSE, CAPILLARY: Glucose-Capillary: 143 mg/dL — ABNORMAL HIGH (ref 65–99)

## 2017-03-06 MED ORDER — ASPIRIN EC 81 MG PO TBEC: 81.0000 mg | DELAYED_RELEASE_TABLET | Freq: Every day | ORAL | Status: AC

## 2017-03-06 NOTE — Op Note (Signed)
NAME:  Darren Randall, Darren Randall                    ACCOUNT NO.:  MEDICAL RECORD NO.:  [id number]  LOCATION:                                 FACILITY:  PHYSICIAN:  Susa Day, M.D.         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  POSTOPERATIVE DIAGNOSES:  Spinal stenosis, herniated nucleus pulposus, L5-S1, right.  Epidural venous plexus.  PROCEDURE PERFORMED: 1. Microlumbar decompression, L5-S1, right. 2. Foraminotomies, L5-S1, right. 3. Microdiskectomy, L5-S1, right. 4. Lysis of epidural venous plexus.  ANESTHESIA:  General.  ASSISTANT:  Lacie Draft, PA.  SPECIMEN:  L5-S1 disk to Pathology.  HISTORY:  76, right lower extremity radicular pain, S1 nerve root distribution secondary to disk herniation and lateral recess stenosis, indicated for decompression failing conservative treatment.  Risks and benefits were discussed including bleeding, infection, damage to the neurovascular structures, no change in symptoms, worsening symptoms, DVT, PE, anesthetic complications, etc.  TECHNIQUE:  With the patient in supine position after induction of adequate general anesthesia, 2 g of Kefzol, placed prone on the [place] frame.  All bony prominences were well padded.  Lumbar region was prepped and draped in usual sterile fashion.  Two 18-gauge spinal needles were utilized to locate the L5-S1 interspace, confirmed with x- ray.  Incision was made from the spinous process at L5-S1.  Subcutaneous tissue was dissected.  Electrocautery was utilized to achieve hemostasis.  Dorsolumbar fascia was divided in line with skin incision. Paraspinous muscle elevated from lamina of L5-S1.  McCullough retractor was placed, operating microscope draped and brought on the surgical field.  Confirmatory radiograph obtained.  Straight curette was  utilized to detach the ligamentum flavum from the cephalad edge of S1, which was angled inward.  A neuro patty placed beneath the ligamentum flavum. With the neural elements protected, I performed a foraminotomy, generous of S1.  I then identified the S1 nerve root, which was compressed into the lateral recess, gently mobilized it medially.  There was a tethering epidural venous plexus tethering laterally.  The epidural venous plexus was isolated, cauterized and lysed further mobilizing the S1 nerve root medially.  Hypertrophic facet ligamentum was noted.  I decompressed the lateral recess to the medial border of the pedicle protecting the 5 root as well, performing a foraminotomy of L5.  A focal HNP was noted.  I performed an annulotomy and disk material was removed from the disk space with a micropituitary.  Further mobilized with catheter, lavage and removal of disk material.  Confirmatory radiograph obtained.  A neural probe passed freely at the foramen of L5 and S1, following the decompression.  There was 1 cm of excursion of the S1 nerve root medial to pedicle without tension.  There was no disk herniation noted beneath the thecal sac, the axilla or the shoulder of the root or within the foramen.  No evidence of active bleeding or CSF leakage.  I therefore, draped the epidural fat over the S1 nerve root.  We removed the [facility] retractor, irrigated the paraspinous  musculature, closed the dorsolumbar fascia with 1 Vicryl, subcu with 2 and skin with Prolene. Sterile dressing applied.  Placed supine on the hospital bed, extubated without difficulty and transported to the recovery room in satisfactory condition.  The patient tolerated the procedure well.  No complications.  Assistant, Lacie Draft, [place].  Minimal blood loss.     Susa Day, M.D.     Geralynn Rile  D:  03/05/2017  T:  03/05/2017  Job:  [id number]

## 2017-03-06 NOTE — Progress Notes (Signed)
Subjective: 1 Day Post-Op Procedure(s) (LRB): Microlumbar decompression L5-S1 Right (Right) Patient reports pain as mild.  Leg pain resolved. Reports incisional back pain. Voiding without difficulty.  Objective: Vital signs in last 24 hours: Temp:  [98.2 F (36.8 C)-98.6 F (37 C)] 98.6 F (37 C) (02/14 0513) Pulse Rate:  [55-76] 65 (02/14 0513) Resp:  [14-19] 16 (02/14 0513) BP: (126-156)/(60-84) 131/62 (02/14 0513) SpO2:  [92 %-100 %] 96 % (02/14 0513) Weight:  [83 kg (183 lb)] 83 kg (183 lb) (02/13 1350)  Intake/Output from previous day: 02/13 0701 - 02/14 0700 In: 2468.3 [P.O.:900; I.V.:1413.3; IV Piggyback:155] Out: 2113 [Urine:2088; Blood:25] Intake/Output this shift: No intake/output data recorded.  No results for input(s): HGB in the last 72 hours. No results for input(s): WBC, RBC, HCT, PLT in the last 72 hours. Recent Labs    03/06/17 0618  NA 138  K 4.0  CL 106  CO2 22  BUN 16  CREATININE 0.74  GLUCOSE 164*  CALCIUM 8.3*   No results for input(s): LABPT, INR in the last 72 hours.  Neurologically intact ABD soft Neurovascular intact Sensation intact distally Intact pulses distally Dorsiflexion/Plantar flexion intact Incision: dressing C/D/I and no drainage No cellulitis present Compartment soft no calf pain or sign of DVT  Assessment/Plan: 1 Day Post-Op Procedure(s) (LRB): Microlumbar decompression L5-S1 Right (Right) Advance diet Up with therapy D/C IV fluids  Dr. Tonita Cong discussed D/C instructions, dressing instructions, Lspine precautions D/C today   BISSELL, JACLYN M. 03/06/2017, 7:34 AM

## 2017-03-06 NOTE — Discharge Summary (Signed)
Physician Discharge Summary   Patient ID: Darren Randall MRN: [id number] DOB/AGE: [birth year] 75 y.o.  Admit date: 03/05/2017 Discharge date: 03/06/2017  Primary Diagnosis:   HNP/Stenosis L5-S1 Right  Admission Diagnoses:  Past Medical History:  Diagnosis Date  . Arteriosclerosis   . Cancer [facility]) 2002   colon cancer with chemotherapy   . Chest pain    admitted 08-06-13 per PCP paper  records on chart   . Coronary artery disease   . Diabetes mellitus without complication ([place])   . Fall 10/30/2016   caused back injury   . Fluttering heart   . History of Holter monitoring 04/13/2015  . HLD (hyperlipidemia)   . Hypertension   . Myocardial infarction [facility]) 1981   per paper record office visit with Jasmine December, NP 02-03-17   . Umbilical hernia    Discharge Diagnoses:   Principal Problem:   HNP (herniated nucleus pulposus), lumbar  Procedure:  Procedure(s) (LRB): Microlumbar decompression L5-S1 Right (Right)   Consults: none  HPI:  see H&P    Laboratory Data: Hospital Outpatient Visit on 02/26/2017  Component Date Value Ref Range Status  . Glucose-Capillary 02/26/2017 108* 65 - 99 mg/dL Final  . Hgb A1c MFr Bld 02/26/2017 6.3* 4.8 - 5.6 % Final   Comment: (NOTE) Pre diabetes:          5.7%-6.4% Diabetes:              >6.4% Glycemic control for   <7.0% adults with diabetes   . Mean Plasma Glucose 02/26/2017 134.11  mg/dL Final   Performed at [facility] [address] [place], [place] [address]  . Sodium 02/26/2017 139  135 - 145 mmol/L Final  . Potassium 02/26/2017 4.3  3.5 - 5.1 mmol/L Final  . Chloride 02/26/2017 108  101 - 111 mmol/L Final  . CO2 02/26/2017 25  22 - 32 mmol/L Final  . Glucose, Bld 02/26/2017 119* 65 - 99 mg/dL Final  . BUN 02/26/2017 16  6 - 20 mg/dL Final  . Creatinine, Ser 02/26/2017 0.93  0.61 - 1.24 mg/dL Final  . Calcium 02/26/2017 8.6* 8.9 - 10.3 mg/dL Final  . GFR calc non Af Amer 02/26/2017 >60  >60 mL/min Final  . GFR  calc Af Amer 02/26/2017 >60  >60 mL/min Final   Comment: (NOTE) The eGFR has been calculated using the CKD EPI equation. This calculation has not been validated in all clinical situations. eGFR's persistently <60 mL/min signify possible Chronic Kidney Disease.   Georgiann Hahn gap 02/26/2017 6  5 - 15 Final   Performed at [facility], [place] [address] [place], [place] [address]  . WBC 02/26/2017 7.2  4.0 - 10.5 K/uL Final  . RBC 02/26/2017 4.51  4.22 - 5.81 MIL/uL Final  . Hemoglobin 02/26/2017 14.3  13.0 - 17.0 g/dL Final  . HCT 02/26/2017 41.9  39.0 - 52.0 % Final  . MCV 02/26/2017 92.9  78.0 - 100.0 fL Final  . MCH 02/26/2017 31.7  26.0 - 34.0 pg Final  . MCHC 02/26/2017 34.1  30.0 - 36.0 g/dL Final  . RDW 02/26/2017 12.9  11.5 - 15.5 % Final  . Platelets 02/26/2017 138* 150 - 400 K/uL Final   Performed at [facility], [place] [address], [place], [place] [address]  . MRSA, PCR 02/26/2017 NEGATIVE  NEGATIVE Final  . Staphylococcus aureus 02/26/2017 NEGATIVE  NEGATIVE Final   Comment: (NOTE) The Xpert SA Assay (FDA approved for NASAL  specimens in patients 44 years of age and older), is one component of a comprehensive surveillance program. It is not intended to diagnose infection nor to guide or monitor treatment. Performed at [facility], [place] [address], [place], [place] [address]    No results for input(s): HGB in the last 72 hours. No results for input(s): WBC, RBC, HCT, PLT in the last 72 hours. Recent Labs    03/06/17 0618  NA 138  K 4.0  CL 106  CO2 22  BUN 16  CREATININE 0.74  GLUCOSE 164*  CALCIUM 8.3*   No results for input(s): LABPT, INR in the last 72 hours.  X-Rays:Dg Lumbar Spine 2-3 Views  Result Date: 02/26/2017 CLINICAL DATA:  Preop for L5-S1 surgery EXAM: LUMBAR SPINE - 2-3 VIEW COMPARISON:  CT abdomen pelvis of 11/02/2015 FINDINGS: The lumbar vertebrae are in normal alignment. There is mild degenerative  disc disease at L5-S1 where there is some loss of disc space. There is also mild degenerative change at T12-L1 and L1-2. No compression deformity is seen. Mild abdominal aortic atherosclerotic change is present. The SI joints are corticated. IMPRESSION: 1. Normal alignment with mild degenerative disc disease at L5-S1. 2. Mild abdominal aortic atherosclerosis. Electronically Signed   By: Ivar Drape M.D.   On: 02/26/2017 12:13   Dg Spine Portable 1 View  Result Date: 03/05/2017 CLINICAL DATA:  L5-S1 lumbar decompression, intraoperative EXAM: PORTABLE SPINE - 1 VIEW COMPARISON:  Intraoperative lumbar spine radiograph from earlier today FINDINGS: Posterior approach surgical marking device terminates over the posterior L5-S1 disc. Mild multilevel lumbar degenerative disc disease. IMPRESSION: Posterior approach surgical marking device terminates over the posterior L5-S1 disc. Electronically Signed   By: Ilona Sorrel M.D.   On: 03/05/2017 12:17   Dg Spine Portable 1 View  Result Date: 03/05/2017 CLINICAL DATA:  L5-S1 decompression. EXAM: PORTABLE SPINE - 1 VIEW COMPARISON:  Intraoperative x-rays from same day. FINDINGS: Surgical instrumentation projects over the S1 spinous process. No acute osseous abnormality. Facet arthropathy and degenerative disc disease at L5-S1. IMPRESSION: Intraoperative localization as described above. Electronically Signed   By: Titus Dubin M.D.   On: 03/05/2017 11:46   Dg Spine Portable 1 View  Result Date: 03/05/2017 CLINICAL DATA:  Decompressive laminectomy EXAM: PORTABLE SPINE - 1 VIEW COMPARISON:  February 26, 2017 lumbar spine radiograph FINDINGS: Cross-table lateral lumbar image labeled #1 submitted. Metallic probe tips are posterior to the superior aspect of the L5 vertebral body and superior to the S1 vertebral body respectively. The probe tips are posterior to the superior and inferior aspects of the L5 spinous process respectively. No fracture or spondylolisthesis. There  is mild disc space narrowing at L5-S1. There is aortic atherosclerosis. IMPRESSION: Metallic probe tips are posterior to the superior inferior aspects of the L5 spinous process with tips directed toward the superior aspect of the L5 and superior aspect of the S1 vertebral body respectively. No fracture or spondylolisthesis. There is mild disc space narrowing at L5-S1. There is aortic atherosclerosis. Aortic Atherosclerosis (ICD10-I70.0). Electronically Signed   By: Lowella Grip III M.D.   On: 03/05/2017 11:38    EKG:No orders found for this or any previous visit.   Hospital Course: Patient was admitted to [facility] and taken to the OR and underwent the above state procedure without complications.  Patient tolerated the procedure well and was later transferred to the recovery room and then to the orthopaedic floor for postoperative care.  They were given PO and  IV analgesics for pain control following their surgery.  They were given 24 hours of postoperative antibiotics.   PT was consulted postop to assist with mobility and transfers.  The patient was allowed to be WBAT with therapy and was taught back precautions. Discharge planning was consulted to help with postop disposition and equipment needs.  Patient had a good night on the evening of surgery and started to get up OOB with therapy on day one. Patient was seen in rounds and was ready to go home on day one.  They were given discharge instructions and dressing directions.  They were instructed on when to follow up in the office with Dr. Tonita Cong.   Diet: Diabetic diet Activity:WBAT; Lspine precautions Follow-up:in 10-14 days Disposition - Home Discharged Condition: good   Discharge Instructions    Call MD / Call 911   Complete by:  As directed    If you experience chest pain or shortness of breath, CALL 911 and be transported to the hospital emergency room.  If you develope a fever above 101 F, pus (white drainage) or increased  drainage or redness at the wound, or calf pain, call your surgeon's office.   Constipation Prevention   Complete by:  As directed    Drink plenty of fluids.  Prune juice may be helpful.  You may use a stool softener, such as Colace (over the counter) 100 mg twice a day.  Use MiraLax (over the counter) for constipation as needed.   Diet - low sodium heart healthy   Complete by:  As directed    Increase activity slowly as tolerated   Complete by:  As directed      Allergies as of 03/06/2017   No Known Allergies     Medication List    STOP taking these medications   atorvastatin 40 MG tablet Commonly known as:  LIPITOR   CALCIUM PO     TAKE these medications   aspirin EC 81 MG tablet Take 1 tablet (81 mg total) by mouth daily. Resume 4 days post-op What changed:  additional instructions   atenolol 100 MG tablet Commonly known as:  TENORMIN Take 100 mg by mouth daily.   docusate sodium 100 MG capsule Commonly known as:  COLACE Take 1 capsule (100 mg total) by mouth 2 (two) times daily as needed for mild constipation.   finasteride 5 MG tablet Commonly known as:  PROSCAR Take 5 mg by mouth daily.   metFORMIN 500 MG tablet Commonly known as:  GLUCOPHAGE Take 500 mg by mouth 2 (two) times daily with a meal.   moexipril-hydrochlorothiazide 15-25 MG tablet Commonly known as:  UNIRETIC Take 1 tablet by mouth daily.   multivitamin with minerals tablet Take 1 tablet by mouth daily.   omeprazole 20 MG capsule Commonly known as:  PRILOSEC Take 20 mg by mouth 2 (two) times daily before a meal.   oxyCODONE-acetaminophen 5-325 MG tablet Commonly known as:  PERCOCET Take 1 tablet by mouth every 4 (four) hours as needed.   polyethylene glycol packet Commonly known as:  MIRALAX / GLYCOLAX Take 17 g by mouth daily.   terazosin 2 MG capsule Commonly known as:  HYTRIN Take 2 mg by mouth daily.      Follow-up Information    Susa Day, MD Follow up in 2 week(s).     Specialty:  Orthopedic Surgery Contact information: [address] [place] [address] [phone]  Signed: Lacie Draft, PA-C Orthopaedic Surgery 03/06/2017, 7:36 AM

## 2017-03-06 NOTE — Progress Notes (Signed)
Physical Therapy Treatment Patient Details Name: Darren Randall MRN: [id number] DOB: Jun 05, 1940 Today's Date: 03/06/2017    History of Present Illness Microlumbar decompression L5-S1 Right     PT Comments    The patient is ready for Dc. Ambulating without assistance.   Follow Up Recommendations  No PT follow up     Equipment Recommendations  None recommended by PT    Recommendations for Other Services       Precautions / Restrictions Precautions Precautions: Back Restrictions Weight Bearing Restrictions: No    Mobility  Bed Mobility   Bed Mobility: Rolling;Sidelying to Sit;Sit to Sidelying Rolling: Modified independent (Device/Increase time) Sidelying to sit: Modified independent (Device/Increase time)     Sit to sidelying: Modified independent (Device/Increase time) General bed mobility comments: oob  Transfers Overall transfer level: Needs assistance Equipment used: None Transfers: Sit to/from Stand Sit to Stand: Supervision            Ambulation/Gait Ambulation/Gait assistance: Supervision Ambulation Distance (Feet): 300 Feet Assistive device: None Gait Pattern/deviations: Step-through pattern         Stairs Stairs: Yes   Stair Management: No rails Number of Stairs: 1    Wheelchair Mobility    Modified Rankin (Stroke Patients Only)       Balance                                            Cognition Arousal/Alertness: Awake/alert Behavior During Therapy: WFL for tasks assessed/performed Overall Cognitive Status: Within Functional Limits for tasks assessed                                        Exercises      General Comments        Pertinent Vitals/Pain Pain Assessment: No/denies pain    Home Living Family/patient expects to be discharged to:: Private residence Living Arrangements: Spouse/significant other Available Help at Discharge: Family         Home Equipment: None       Prior Function Level of Independence: Independent      Comments: currently long distance truck driver   PT Goals (current goals can now be found in the care plan section) Acute Rehab PT Goals Patient Stated Goal: home asap Progress towards PT goals: Progressing toward goals    Frequency           PT Plan Current plan remains appropriate    Co-evaluation              AM-PAC PT "6 Clicks" Daily Activity  Outcome Measure  Difficulty turning over in bed (including adjusting bedclothes, sheets and blankets)?: None Difficulty moving from lying on back to sitting on the side of the bed? : None Difficulty sitting down on and standing up from a chair with arms (e.g., wheelchair, bedside commode, etc,.)?: None Help needed moving to and from a bed to chair (including a wheelchair)?: None Help needed walking in hospital room?: None Help needed climbing 3-5 steps with a railing? : A Little 6 Click Score: 23    End of Session   Activity Tolerance: Patient tolerated treatment well Patient left: in chair Nurse Communication: Mobility status PT Visit Diagnosis: Unsteadiness on feet (R26.81)     Time: 4315-4008 PT Time Calculation (min) (ACUTE ONLY): 16  min  Charges:  $Gait Training: 8-22 mins                    G Codes:          [name] 03/06/2017, 11:49 AM

## 2017-03-06 NOTE — Evaluation (Signed)
Occupational Therapy Evaluation Patient Details Name: Darren Randall MRN: [id number] DOB: 03-16-1940 Today's Date: 03/06/2017    History of Present Illness Microlumbar decompression L5-S1 Right    Clinical Impression   This 77 year old man was admitted for the above sx.  All education was completed.  Pt compliant with back precautions and wife assisted with dressing this am.    Follow Up Recommendations  Supervision/Assistance - 24 hour    Equipment Recommendations  None recommended by OT    Recommendations for Other Services       Precautions / Restrictions Precautions Precautions: Back Restrictions Weight Bearing Restrictions: No      Mobility Bed Mobility               General bed mobility comments: oob  Transfers       Sit to Stand: Supervision              Balance                                           ADL either performed or assessed with clinical judgement   ADL Overall ADL's : Needs assistance/impaired Eating/Feeding: Independent   Grooming: Set up;Sitting   Upper Body Bathing: Set up;Sitting   Lower Body Bathing: Moderate assistance;Sit to/from stand   Upper Body Dressing : Set up;Sitting   Lower Body Dressing: Maximal assistance;Sit to/from stand   Toilet Transfer: Supervision/safety;Ambulation;Comfort height toilet             General ADL Comments: reviewed back precautions and adls. Wife assisted with LB adls and will continue to do this. They have a reacher at home, if pt wants to don pants himself. Wife has arthritis.  Demonstrated side stepping into tub and educated to wash as much as he can without bending then sit on commode for legs     Vision         Perception     Praxis      Pertinent Vitals/Pain Pain Assessment: No/denies pain     Hand Dominance     Extremity/Trunk Assessment Upper Extremity Assessment Upper Extremity Assessment: Overall WFL for tasks assessed            Communication Communication Communication: No difficulties   Cognition Arousal/Alertness: Awake/alert Behavior During Therapy: WFL for tasks assessed/performed Overall Cognitive Status: Within Functional Limits for tasks assessed                                     General Comments       Exercises     Shoulder Instructions      Home Living Family/patient expects to be discharged to:: Private residence Living Arrangements: Spouse/significant other Available Help at Discharge: Family               Bathroom Shower/Tub: Teacher, early years/pre: Handicapped height     Home Equipment: None          Prior Functioning/Environment Level of Independence: Independent        Comments: currently long distance truck driver        OT Problem List:        OT Treatment/Interventions:      OT Goals(Current goals can be found in the care plan section) Acute Rehab OT Goals Patient  Stated Goal: home asap OT Goal Formulation: All assessment and education complete, DC therapy  OT Frequency:     Barriers to D/C:            Co-evaluation              AM-PAC PT "6 Clicks" Daily Activity     Outcome Measure Help from another person eating meals?: None Help from another person taking care of personal grooming?: A Little Help from another person toileting, which includes using toliet, bedpan, or urinal?: A Little Help from another person bathing (including washing, rinsing, drying)?: A Lot Help from another person to put on and taking off regular upper body clothing?: A Little Help from another person to put on and taking off regular lower body clothing?: A Lot 6 Click Score: 17   End of Session    Activity Tolerance: Patient tolerated treatment well Patient left: in chair;with call bell/phone within reach;with chair alarm set;with family/visitor present  OT Visit Diagnosis: Muscle weakness (generalized) (M62.81)                Time:  5643-3295 OT Time Calculation (min): 20 min Charges:  OT General Charges $OT Visit: 1 Visit OT Evaluation $OT Eval Low Complexity: 1 Low G-Codes:     Lesle Chris, OTR/L [phone] 03/06/2017  [name] 03/06/2017, 9:58 AM

## 2019-06-08 IMAGING — CR DG LUMBAR SPINE 2-3V
2 series · 2 of 2 positions shown · non-contrast
Comparison: CT abdomen pelvis of 11/02/2015

CLINICAL DATA: Preop for L5-S1 surgery

EXAM:
LUMBAR SPINE - 2-3 VIEW

[t l-spine a.p.]
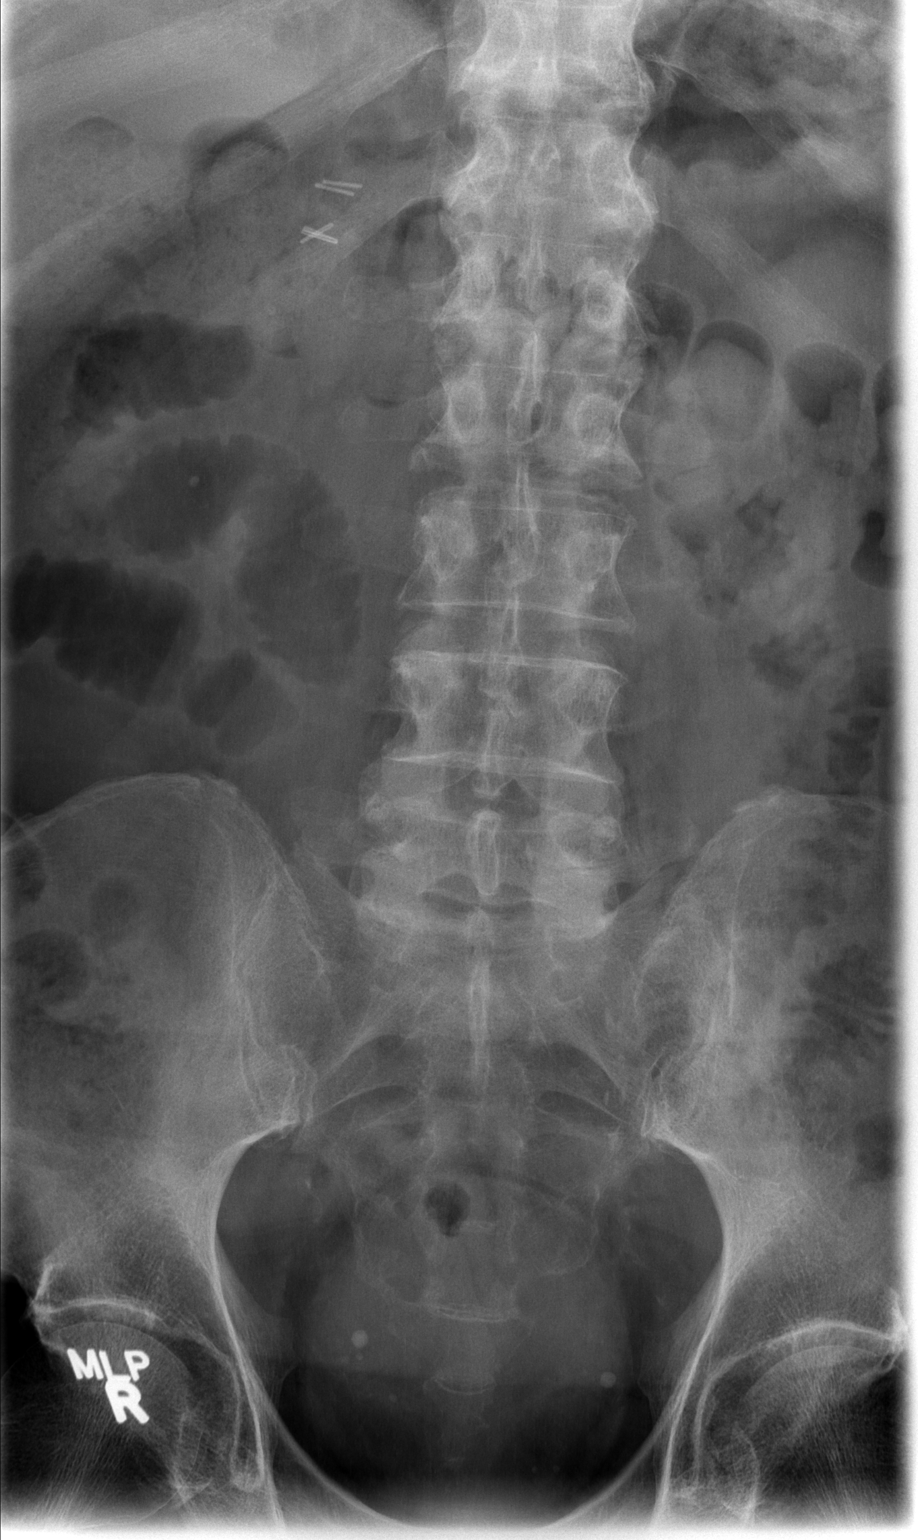

[t l-spine lat]
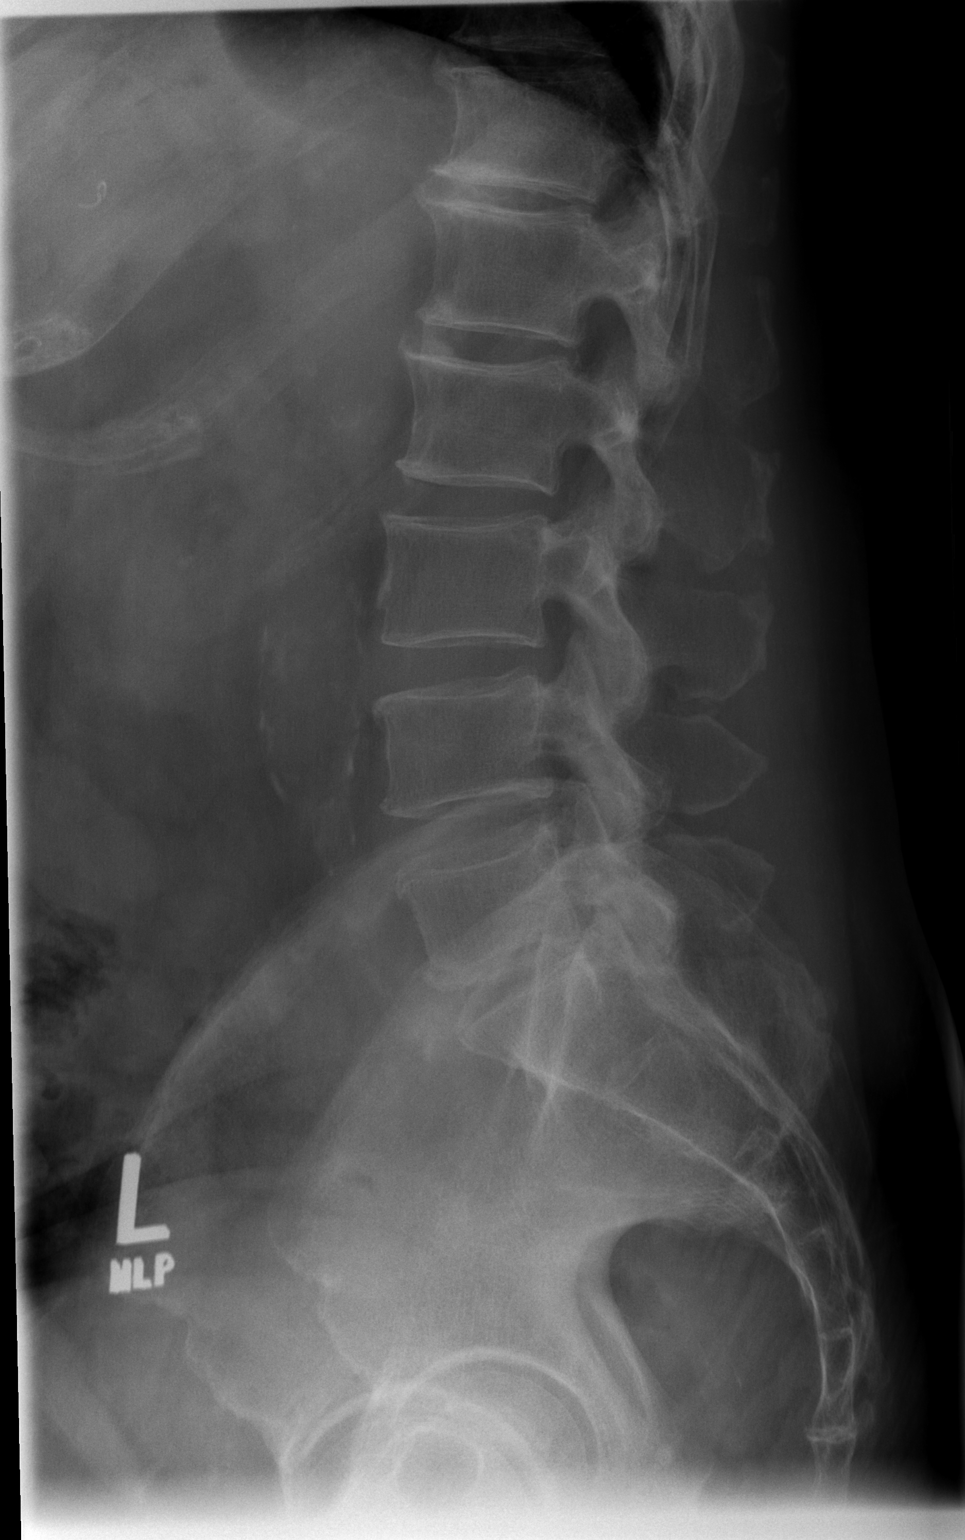

[2 of 2 positions shown; findings below may reference images not displayed]

FINDINGS: The lumbar vertebrae are in normal alignment. There is mild
degenerative disc disease at L5-S1 where there is some loss of disc
space. There is also mild degenerative change at T12-L1 and L1-2. No
compression deformity is seen. Mild abdominal aortic atherosclerotic
change is present. The SI joints are corticated.
IMPRESSION: 1. Normal alignment with mild degenerative disc disease at L5-S1.
2. Mild abdominal aortic atherosclerosis.

## 2019-06-15 IMAGING — DX DG SPINE 1V PORT
1 series · 1 of 1 positions shown · non-contrast
Comparison: Intraoperative lumbar spine radiograph from earlier
today

CLINICAL DATA: L5-S1 lumbar decompression, intraoperative

EXAM:
PORTABLE SPINE - 1 VIEW

[l-spine lat]
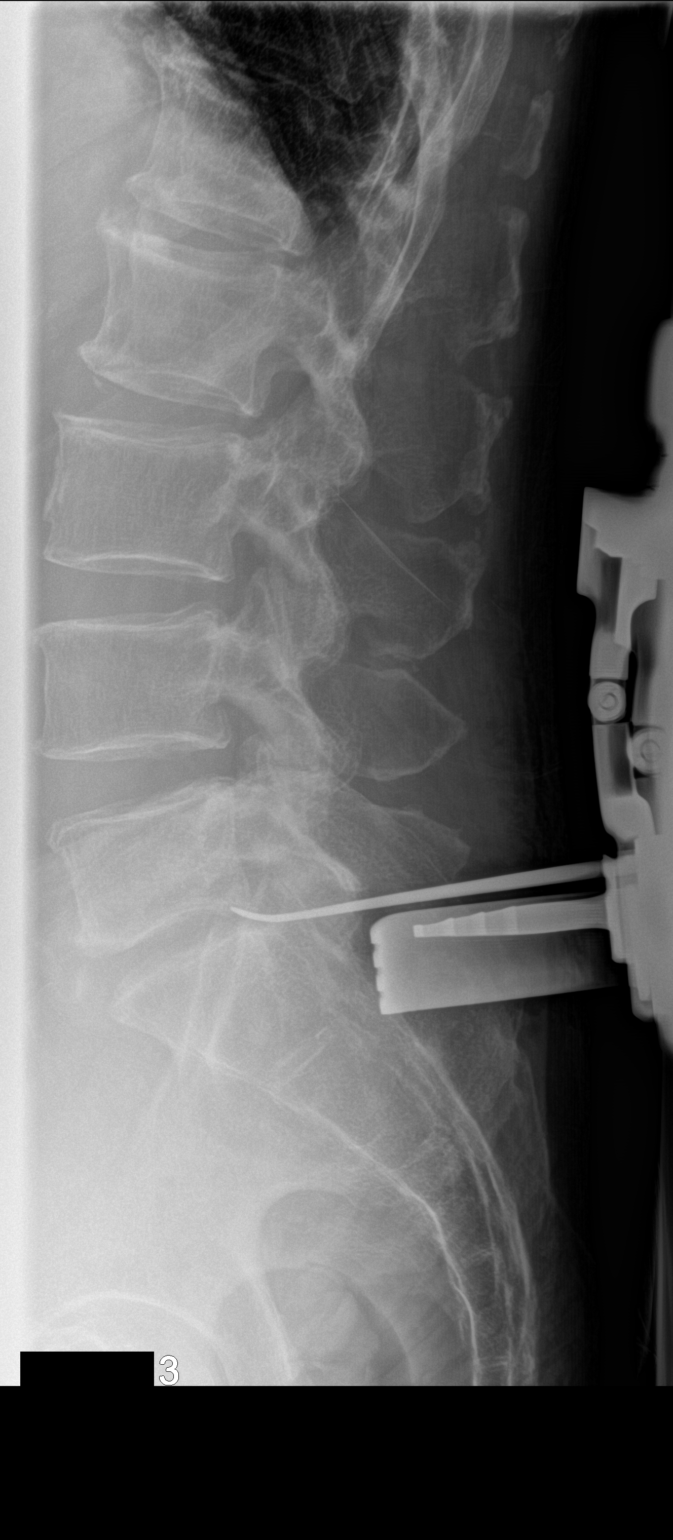

[1 of 1 positions shown; findings below may reference images not displayed]

FINDINGS: Posterior approach surgical marking device terminates over the
posterior L5-S1 disc. Mild multilevel lumbar degenerative disc
disease.
IMPRESSION: Posterior approach surgical marking device terminates over the
posterior L5-S1 disc.
